# Patient Record
Sex: Female | Born: 1970 | Race: White | Hispanic: No | Marital: Married | Smoking: Current every day smoker
Health system: Southern US, Community
[De-identification: ages and names within clinical notes are randomized; demographics above are authoritative.]

## PROBLEM LIST (undated history)

## (undated) DIAGNOSIS — B1920 Unspecified viral hepatitis C without hepatic coma: Secondary | ICD-10-CM

## (undated) HISTORY — PX: ABDOMINAL HYSTERECTOMY: SHX81

---

## 2016-03-11 ENCOUNTER — Emergency Department (HOSPITAL_COMMUNITY)
Admission: EM | Admit: 2016-03-11 | Discharge: 2016-03-11 | Disposition: A | Discharge status: Home / Self Care | Payer: Self-pay | Attending: Emergency Medicine | Admitting: Emergency Medicine

## 2016-03-11 ENCOUNTER — Emergency Department (HOSPITAL_COMMUNITY): Payer: Self-pay

## 2016-03-11 ENCOUNTER — Encounter (HOSPITAL_COMMUNITY): Payer: Self-pay | Admitting: Emergency Medicine

## 2016-03-11 DIAGNOSIS — T7840XA Allergy, unspecified, initial encounter: Secondary | ICD-10-CM

## 2016-03-11 DIAGNOSIS — F1110 Opioid abuse, uncomplicated: Secondary | ICD-10-CM | POA: Insufficient documentation

## 2016-03-11 DIAGNOSIS — T401X1A Poisoning by heroin, accidental (unintentional), initial encounter: Secondary | ICD-10-CM

## 2016-03-11 DIAGNOSIS — Y9289 Other specified places as the place of occurrence of the external cause: Secondary | ICD-10-CM | POA: Insufficient documentation

## 2016-03-11 DIAGNOSIS — Z8619 Personal history of other infectious and parasitic diseases: Secondary | ICD-10-CM | POA: Insufficient documentation

## 2016-03-11 DIAGNOSIS — Y998 Other external cause status: Secondary | ICD-10-CM | POA: Insufficient documentation

## 2016-03-11 DIAGNOSIS — F1910 Other psychoactive substance abuse, uncomplicated: Secondary | ICD-10-CM

## 2016-03-11 DIAGNOSIS — L500 Allergic urticaria: Secondary | ICD-10-CM | POA: Insufficient documentation

## 2016-03-11 DIAGNOSIS — Z880 Allergy status to penicillin: Secondary | ICD-10-CM | POA: Insufficient documentation

## 2016-03-11 DIAGNOSIS — F1410 Cocaine abuse, uncomplicated: Secondary | ICD-10-CM | POA: Insufficient documentation

## 2016-03-11 DIAGNOSIS — Z79899 Other long term (current) drug therapy: Secondary | ICD-10-CM | POA: Insufficient documentation

## 2016-03-11 DIAGNOSIS — K5900 Constipation, unspecified: Secondary | ICD-10-CM

## 2016-03-11 DIAGNOSIS — Y9389 Activity, other specified: Secondary | ICD-10-CM | POA: Insufficient documentation

## 2016-03-11 DIAGNOSIS — R062 Wheezing: Secondary | ICD-10-CM | POA: Insufficient documentation

## 2016-03-11 DIAGNOSIS — F1510 Other stimulant abuse, uncomplicated: Secondary | ICD-10-CM | POA: Insufficient documentation

## 2016-03-11 HISTORY — DX: Unspecified viral hepatitis C without hepatic coma: B19.20

## 2016-03-11 LAB — RAPID URINE DRUG SCREEN, HOSP PERFORMED
Amphetamines: POSITIVE — AB
BARBITURATES: NOT DETECTED
BENZODIAZEPINES: NOT DETECTED
Cocaine: POSITIVE — AB
Opiates: POSITIVE — AB
Tetrahydrocannabinol: NOT DETECTED

## 2016-03-11 LAB — CBC WITH DIFFERENTIAL/PLATELET
BASOS PCT: 0 %
Basophils Absolute: 0 10*3/uL (ref 0.0–0.1)
Eosinophils Absolute: 0.2 10*3/uL (ref 0.0–0.7)
Eosinophils Relative: 2 %
HCT: 35.2 % — ABNORMAL LOW (ref 36.0–46.0)
Hemoglobin: 11.4 g/dL — ABNORMAL LOW (ref 12.0–15.0)
LYMPHS PCT: 5 %
Lymphs Abs: 0.5 10*3/uL — ABNORMAL LOW (ref 0.7–4.0)
MCH: 29.6 pg (ref 26.0–34.0)
MCHC: 32.4 g/dL (ref 30.0–36.0)
MCV: 91.4 fL (ref 78.0–100.0)
MONO ABS: 0.4 10*3/uL (ref 0.1–1.0)
MONOS PCT: 5 %
NEUTROS ABS: 8.3 10*3/uL — AB (ref 1.7–7.7)
Neutrophils Relative %: 88 %
Platelets: 386 10*3/uL (ref 150–400)
RBC: 3.85 MIL/uL — ABNORMAL LOW (ref 3.87–5.11)
RDW: 14.6 % (ref 11.5–15.5)
WBC: 9.5 10*3/uL (ref 4.0–10.5)

## 2016-03-11 LAB — ETHANOL

## 2016-03-11 LAB — URINE MICROSCOPIC-ADD ON

## 2016-03-11 LAB — URINALYSIS, ROUTINE W REFLEX MICROSCOPIC
Bilirubin Urine: NEGATIVE
Glucose, UA: 100 mg/dL — AB
Ketones, ur: NEGATIVE mg/dL
Nitrite: POSITIVE — AB
PROTEIN: 100 mg/dL — AB
Specific Gravity, Urine: 1.023 (ref 1.005–1.030)
pH: 6 (ref 5.0–8.0)

## 2016-03-11 LAB — COMPREHENSIVE METABOLIC PANEL
ALT: 37 U/L (ref 14–54)
ANION GAP: 9 (ref 5–15)
AST: 35 U/L (ref 15–41)
Albumin: 3.8 g/dL (ref 3.5–5.0)
Alkaline Phosphatase: 71 U/L (ref 38–126)
BILIRUBIN TOTAL: 0.3 mg/dL (ref 0.3–1.2)
BUN: 7 mg/dL (ref 6–20)
CO2: 24 mmol/L (ref 22–32)
Calcium: 8.6 mg/dL — ABNORMAL LOW (ref 8.9–10.3)
Chloride: 103 mmol/L (ref 101–111)
Creatinine, Ser: 0.74 mg/dL (ref 0.44–1.00)
GFR calc Af Amer: >60 mL/min (ref >60)
Glucose, Bld: 132 mg/dL — ABNORMAL HIGH (ref 65–99)
POTASSIUM: 3.6 mmol/L (ref 3.5–5.1)
Sodium: 136 mmol/L (ref 135–145)
TOTAL PROTEIN: 7 g/dL (ref 6.5–8.1)

## 2016-03-11 LAB — SALICYLATE LEVEL

## 2016-03-11 LAB — MAGNESIUM: MAGNESIUM: 1.7 mg/dL (ref 1.7–2.4)

## 2016-03-11 MED ORDER — POLYETHYLENE GLYCOL 3350 17 G PO PACK: 17.0000 g | PACK | Freq: Every day | ORAL | Status: AC

## 2016-03-11 MED ORDER — DOCUSATE SODIUM 100 MG PO CAPS: 100.0000 mg | ORAL_CAPSULE | Freq: Two times a day (BID) | ORAL | Status: AC

## 2016-03-11 MED ORDER — METHYLPREDNISOLONE SODIUM SUCC 125 MG IJ SOLR
125.0000 mg | Freq: Once | INTRAMUSCULAR | Status: AC
Start: 2016-03-11 — End: 2016-03-11
  Administered 2016-03-11: 125 mg via INTRAVENOUS

## 2016-03-11 MED ORDER — DIPHENHYDRAMINE HCL 50 MG/ML IJ SOLN
25.0000 mg | Freq: Once | INTRAMUSCULAR | Status: DC
  Filled 2016-03-11: qty 1

## 2016-03-11 MED ORDER — METHYLPREDNISOLONE SODIUM SUCC 125 MG IJ SOLR
125.0000 mg | Freq: Once | INTRAMUSCULAR | Status: DC
  Filled 2016-03-11: qty 2

## 2016-03-11 MED ORDER — FAMOTIDINE IN NACL 20-0.9 MG/50ML-% IV SOLN
20.0000 mg | Freq: Once | INTRAVENOUS | Status: AC
  Administered 2016-03-11: 20 mg via INTRAVENOUS
  Filled 2016-03-11: qty 50

## 2016-03-11 MED ORDER — PREDNISONE 20 MG PO TABS: 40.0000 mg | ORAL_TABLET | Freq: Every day | ORAL | Status: DC

## 2016-03-11 MED ORDER — ALBUTEROL SULFATE (2.5 MG/3ML) 0.083% IN NEBU
5.0000 mg | INHALATION_SOLUTION | Freq: Once | RESPIRATORY_TRACT | Status: AC
  Administered 2016-03-11: 5 mg via RESPIRATORY_TRACT
  Filled 2016-03-11: qty 6

## 2016-03-11 MED ORDER — DIPHENHYDRAMINE HCL 25 MG PO TABS: 25.0000 mg | ORAL_TABLET | Freq: Four times a day (QID) | ORAL | Status: AC | PRN

## 2016-03-11 MED ORDER — SODIUM CHLORIDE 0.9 % IV BOLUS (SEPSIS)
1000.0000 mL | Freq: Once | INTRAVENOUS | Status: AC
  Administered 2016-03-11: 1000 mL via INTRAVENOUS

## 2016-03-11 MED ORDER — NITROFURANTOIN MONOHYD MACRO 100 MG PO CAPS: 100.0000 mg | ORAL_CAPSULE | Freq: Two times a day (BID) | ORAL | Status: AC

## 2016-03-11 MED ORDER — METRONIDAZOLE 500 MG PO TABS: 500.0000 mg | ORAL_TABLET | Freq: Two times a day (BID) | ORAL | Status: AC

## 2016-03-11 NOTE — ED Provider Notes (Signed)
CSN: [id number]     Arrival date & time 03/11/16  1822 History   First MD Initiated Contact with Patient 03/11/16 1829     Chief Complaint  Patient presents with  . Drug Overdose     (Consider location/radiation/quality/duration/timing/severity/associated sxs/prior Treatment) HPI Patient found unresponsive and EMS called. Lying supine with agonal breathing. Syringe is lying on her side. Given IM and IV Narcan. Patient with improvement of alertness. Then developed hives on the left forearm. Given IV Benadryl. Patient states she's been feeling unwell today. Had nasal congestion. Denies any any intraoral swelling or difficulty breathing. States she has not used heroin in several months. States she is a smaller dose than normal. Denies any other coingestants. Past Medical History  Diagnosis Date  . Hepatitis C    No past surgical history on file. No family history on file. Social History  Substance Use Topics  . Smoking status: Not on file  . Smokeless tobacco: Not on file  . Alcohol Use: Not on file   OB History    No data available     Review of Systems  Constitutional: Negative for fever and chills.  HENT: Negative for facial swelling.   Eyes: Negative for visual disturbance.  Respiratory: Positive for wheezing. Negative for shortness of breath.   Cardiovascular: Negative for chest pain and leg swelling.  Gastrointestinal: Negative for nausea, vomiting and abdominal pain.  Genitourinary: Negative for dysuria, frequency and flank pain.  Musculoskeletal: Negative for back pain, neck pain and neck stiffness.  Skin: Negative for rash and wound.  Neurological: Negative for dizziness, weakness, light-headedness, numbness and headaches.  All other systems reviewed and are negative.     Allergies  Penicillins and Sulfa antibiotics  Home Medications   Prior to Admission medications   Medication Sig Start Date End Date Taking? Authorizing Provider  ibuprofen (ADVIL,MOTRIN) 200  MG tablet Take 400-800 mg by mouth 2 (two) times daily as needed for moderate pain.   Yes Historical Provider, MD  diphenhydrAMINE (BENADRYL) 25 MG tablet Take 1 tablet (25 mg total) by mouth every 6 (six) hours as needed for itching or allergies. 03/11/16   Loren Racer, MD  docusate sodium (COLACE) 100 MG capsule Take 1 capsule (100 mg total) by mouth every 12 (twelve) hours. 03/11/16   Loren Racer, MD  metroNIDAZOLE (FLAGYL) 500 MG tablet Take 1 tablet (500 mg total) by mouth 2 (two) times daily. One po bid x 7 days 03/11/16   Loren Racer, MD  nitrofurantoin, macrocrystal-monohydrate, (MACROBID) 100 MG capsule Take 1 capsule (100 mg total) by mouth 2 (two) times daily. X 7 days 03/11/16   Loren Racer, MD  polyethylene glycol [facility] / Ethelene Hal) packet Take 17 g by mouth daily. 03/11/16   Loren Racer, MD  predniSONE (DELTASONE) 20 MG tablet Take 2 tablets (40 mg total) by mouth daily. 03/11/16   Loren Racer, MD   BP 109/66 mmHg  Pulse 88  Temp(Src) 98.2 F (36.8 C) (Oral)  Resp 23  SpO2 92% Physical Exam  Constitutional: She is oriented to person, place, and time. She appears well-developed and well-nourished. No distress.  HENT:  Head: Normocephalic and atraumatic.  Mouth/Throat: Oropharynx is clear and moist. No oropharyngeal exudate.  Poor dentition  Eyes: EOM are normal. Pupils are equal, round, and reactive to light.  Neck: Normal range of motion. Neck supple.  No meningismus. No posterior midline cervical tenderness to palpation.  Cardiovascular: Normal rate and regular rhythm.  Exam reveals no gallop and no  friction rub.   No murmur heard. Pulmonary/Chest: Effort normal. No respiratory distress. She has wheezes (expiratory wheezing). She has no rales. She exhibits no tenderness.  Abdominal: Soft. Bowel sounds are normal. She exhibits no distension and no mass. There is no tenderness. There is no rebound and no guarding.  Musculoskeletal: Normal range of motion. She  exhibits no edema or tenderness.  Patient with track marks bilateral forearms. No obvious hives noted. Distal pulses are equal and intact. No lower extremity swelling or asymmetry.  Neurological: She is alert and oriented to person, place, and time.  5/5 motor in all extremities. Sensation is fully intact.  Skin: Skin is warm and dry. No rash noted. No erythema.  Nursing note and vitals reviewed.   ED Course  Procedures (including critical care time) Labs Review Labs Reviewed  CBC WITH DIFFERENTIAL/PLATELET - Abnormal; Notable for the following:    RBC 3.85 (*)    Hemoglobin 11.4 (*)    HCT 35.2 (*)    Neutro Abs 8.3 (*)    Lymphs Abs 0.5 (*)    All other components within normal limits  COMPREHENSIVE METABOLIC PANEL - Abnormal; Notable for the following:    Glucose, Bld 132 (*)    Calcium 8.6 (*)    All other components within normal limits  URINALYSIS, ROUTINE W REFLEX MICROSCOPIC (NOT AT [facility]) - Abnormal; Notable for the following:    APPearance TURBID (*)    Glucose, UA 100 (*)    Hgb urine dipstick TRACE (*)    Protein, ur 100 (*)    Nitrite POSITIVE (*)    Leukocytes, UA SMALL (*)    All other components within normal limits  URINE RAPID DRUG SCREEN, HOSP PERFORMED - Abnormal; Notable for the following:    Opiates POSITIVE (*)    Cocaine POSITIVE (*)    Amphetamines POSITIVE (*)    All other components within normal limits  URINE MICROSCOPIC-ADD ON - Abnormal; Notable for the following:    Squamous Epithelial / LPF TOO NUMEROUS TO COUNT (*)    Bacteria, UA MANY (*)    All other components within normal limits  SALICYLATE LEVEL  ETHANOL  MAGNESIUM  OCCULT BLOOD X 1 CARD TO LAB, STOOL    Imaging Review Dg Abd 1 View  03/11/2016  CLINICAL DATA:  Abdominal distension and constipation for 4 weeks. EXAM: ABDOMEN - 1 VIEW COMPARISON:  None. FINDINGS: The bowel gas pattern is normal. Extensive bowel content is identified throughout colon. There is scoliosis of spine.  Prior cholecystectomy clips are noted. No radio-opaque calculi or other significant radiographic abnormality are seen. IMPRESSION: Constipation.  No bowel obstruction. Electronically Signed   By: Sherian Rein M.D.   On: 03/11/2016 20:42   Dg Chest Port 1 View  03/11/2016  CLINICAL DATA:  Wheezing and hives of left forearm. EXAM: PORTABLE CHEST 1 VIEW COMPARISON:  None. FINDINGS: The heart size and mediastinal contours are within normal limits. Both lungs are clear. The visualized skeletal structures are unremarkable. IMPRESSION: No active cardiopulmonary disease. Electronically Signed   By: Sherian Rein M.D.   On: 03/11/2016 18:56   I have personally reviewed and evaluated these images and lab results as part of my medical decision-making.   EKG Interpretation   Date/Time:  Tuesday Mar 11 2016 19:33:19 EDT Ventricular Rate:  111 PR Interval:  128 QRS Duration: 100 QT Interval:  338 QTC Calculation: 459 R Axis:   52 Text Interpretation:  Sinus tachycardia Probable left atrial enlargement  Confirmed by Ranae PalmsYELVERTON  MD, Avanish Cerullo ([id number]) on 03/11/2016 7:55:18 PM      MDM   Final diagnoses:  Heroin overdose, accidental or unintentional, initial encounter  Polysubstance abuse  Constipation, unspecified constipation type  Allergic reaction caused by a drug   Wheezing has improved after nebulized treatment. She remains alert and emergency department. Complains of constipation and abdominal distention for the past few days.   Patient continues to be alert and emergency department. Stable vital signs. UDS positive for amphetamines and cocaine as well as opiates. Start on bowel regimen. Given behavior health resources.   Loren Raceravid Zhaire Locker, MD 03/11/16 2256

## 2016-03-11 NOTE — ED Notes (Signed)
Per EMS pt was found supine with agonal breathing, syringes lying around her. EMS gave 3mg  IM and 1mg  IV of narcan. EMS noticed hives forming on left forearm and administered 50mg  benadryl IV. A&0.

## 2016-03-11 NOTE — Discharge Instructions (Signed)
Accidental Overdose °A drug overdose occurs when a chemical substance (drug or medication) is used in amounts large enough to overcome a person. This may result in severe illness or death. This is a type of poisoning. Accidental overdoses of medications or other substances come from a variety of reasons. When this happens accidentally, it is often because the person taking the substance does not know enough about what they have taken. Drugs which commonly cause overdose deaths are alcohol, psychotropic medications (medications which affect the mind), pain medications, illegal drugs (street drugs) such as cocaine and heroin, and multiple drugs taken at the same time. It may result from careless behavior (such as over-indulging at a party). Other causes of overdose may include multiple drug use, a lapse in memory, or drug use after a period of no drug use.  °Sometimes overdosing occurs because a person cannot remember if they have taken their medication.  °A common unintentional overdose in young children involves multi-vitamins containing iron. Iron is a part of the hemoglobin molecule in blood. It is used to transport oxygen to living cells. When taken in small amounts, iron allows the body to restock hemoglobin. In large amounts, it causes problems in the body. If this overdose is not treated, it can lead to death. °Never take medicines that show signs of tampering or do not seem quite right. Never take medicines in the dark or in poor lighting. Read the label and check each dose of medicine before you take it. When adults are poisoned, it happens most often through carelessness or lack of information. Taking medicines in the dark or taking medicine prescribed for someone else to treat the same type of problem is a dangerous practice. °SYMPTOMS  °Symptoms of overdose depend on the medication and amount taken. They can vary from over-activity with stimulant over-dosage, to sleepiness from depressants such as  alcohol, narcotics and tranquilizers. Confusion, dizziness, nausea and vomiting may be present. If problems are severe enough coma and death may result. °DIAGNOSIS  °Diagnosis and management are generally straightforward if the drug is known. Otherwise it is more difficult. At times, certain symptoms and signs exhibited by the patient, or blood tests, can reveal the drug in question.  °TREATMENT  °In an emergency department, most patients can be treated with supportive measures. Antidotes may be available if there has been an overdose of opioids or benzodiazepines. A rapid improvement will often occur if this is the cause of overdose. °At home or away from medical care: °· There may be no immediate problems or warning signs in children. °· Not everything works well in all cases of poisoning. °· Take immediate action. Poisons may act quickly. °· If you think someone has swallowed medicine or a household product, and the person is unconscious, having seizures (convulsions), or is not breathing, immediately call for an ambulance. °IF a person is conscious and appears to be doing OK but has swallowed a poison: °· Do not wait to see what effect the poison will have. Immediately call a poison control center (listed in the white pages of your telephone book under "Poison Control" or inside the front cover with other emergency numbers). Some poison control centers have TTY capability for the deaf. Check with your local center if you or someone in your family requires this service. °· Keep the container so you can read the label on the product for ingredients. °· Describe what, when, and how much was taken and the age and condition of the person poisoned.   Inform them if the person is vomiting, choking, drowsy, shows a change in color or temperature of skin, is conscious or unconscious, or is convulsing.  Do not cause vomiting unless instructed by medical personnel. Do not induce vomiting or force liquids into a person who  is convulsing, unconscious, or very drowsy. Stay calm and in control.   Activated charcoal also is sometimes used in certain types of poisoning and you may wish to add a supply to your emergency medicines. It is available without a prescription. Call a poison control center before using this medication. PREVENTION  Thousands of children die every year from unintentional poisoning. This may be from household chemicals, poisoning from carbon monoxide in a car, taking their parent's medications, or simply taking a few iron pills or vitamins with iron. Poisoning comes from unexpected sources.  Store medicines out of the sight and reach of children, preferably in a locked cabinet. Do not keep medications in a food cabinet. Always store your medicines in a secure place. Get rid of expired medications.  If you have children living with you or have them as occasional guests, you should have child-resistant caps on your medicine containers. Keep everything out of reach. Child proof your home.  If you are called to the telephone or to answer the door while you are taking a medicine, take the container with you or put the medicine out of the reach of small children.  Do not take your medication in front of children. Do not tell your child how good a medication is and how good it is for them. They may get the idea it is more of a treat.  If you are an adult and have accidentally taken an overdose, you need to consider how this happened and what can be done to prevent it from happening again. If this was from a street drug or alcohol, determine if there is a problem that needs addressing. If you are not sure a problems exists, it is easy to talk to a professional and ask them if they think you have a problem. It is better to handle this problem in this way before it happens again and has a much worse consequence.   This information is not intended to replace advice given to you by your health care provider. Make  sure you discuss any questions you have with your health care provider.   Document Released: 01/10/2005 Document Revised: 11/17/2014 Document Reviewed: 04/16/2015 Elsevier Interactive Patient Education 2016 [employer].  Constipation, Adult Constipation is when a person has fewer than three bowel movements a week, has difficulty having a bowel movement, or has stools that are dry, hard, or larger than normal. As people grow older, constipation is more common. A low-fiber diet, not taking in enough fluids, and taking certain medicines may make constipation worse.  CAUSES   Certain medicines, such as antidepressants, pain medicine, iron supplements, antacids, and water pills.   Certain diseases, such as diabetes, irritable bowel syndrome (IBS), thyroid disease, or depression.   Not drinking enough water.   Not eating enough fiber-rich foods.   Stress or travel.   Lack of physical activity or exercise.   Ignoring the urge to have a bowel movement.   Using laxatives too much.  SIGNS AND SYMPTOMS   Having fewer than three bowel movements a week.   Straining to have a bowel movement.   Having stools that are hard, dry, or larger than normal.   Feeling full or bloated.  Pain in the lower abdomen.   Not feeling relief after having a bowel movement.  DIAGNOSIS  Your health care provider will take a medical history and perform a physical exam. Further testing may be done for severe constipation. Some tests may include:  A barium enema X-ray to examine your rectum, colon, and, sometimes, your small intestine.   A sigmoidoscopy to examine your lower colon.   A colonoscopy to examine your entire colon. TREATMENT  Treatment will depend on the severity of your constipation and what is causing it. Some dietary treatments include drinking more fluids and eating more fiber-rich foods. Lifestyle treatments may include regular exercise. If these diet and lifestyle  recommendations do not help, your health care provider may recommend taking over-the-counter laxative medicines to help you have bowel movements. Prescription medicines may be prescribed if over-the-counter medicines do not work.  HOME CARE INSTRUCTIONS   Eat foods that have a lot of fiber, such as fruits, vegetables, whole grains, and beans.  Limit foods high in fat and processed sugars, such as french fries, hamburgers, cookies, candies, and soda.   A fiber supplement may be added to your diet if you cannot get enough fiber from foods.   Drink enough fluids to keep your urine clear or pale yellow.   Exercise regularly or as directed by your health care provider.   Go to the restroom when you have the urge to go. Do not hold it.   Only take over-the-counter or prescription medicines as directed by your health care provider. Do not take other medicines for constipation without talking to your health care provider first.  SEEK IMMEDIATE MEDICAL CARE IF:   You have bright red blood in your stool.   Your constipation lasts for more than 4 days or gets worse.   You have abdominal or rectal pain.   You have thin, pencil-like stools.   You have unexplained weight loss. MAKE SURE YOU:   Understand these instructions.  Will watch your condition.  Will get help right away if you are not doing well or get worse.   This information is not intended to replace advice given to you by your health care provider. Make sure you discuss any questions you have with your health care provider.   Document Released: 07/25/2004 Document Revised: 11/17/2014 Document Reviewed: 08/08/2013 Elsevier Interactive Patient Education 2016 [employer]. Substance Abuse Treatment Programs  Intensive Outpatient Programs [facility]     [address]      [place], [place]                   [phone]       [facility] [address] [place] [address] [place],  [place] [phone]  [facility]     (Inpatient and outpatient)     [address]           [phone]    [facility] [phone] (Suboxone and Methadone)  [address]      [place], [place] [address]      [phone]       [address] [place], [place] [phone]  [facility] (Outpatient/Inpatient, Chemical)    (insurance only) [phone]             [facility] (Groups & Residential) [place], [place] [phone]     [facility]     [address]     [place], [place]  [phone]       [facility] (for caregivers and family) [address] [place], [place] [phone]      Residential Treatment Programs [facility]      [address] [place], [place] [address]  [phone]       [facility] [address] [place], [place] [address] [phone]  [facility] [place]        [phone]       [facility] [phone]  [facility] ([facility]             [address]                                         [place], [place]                                                [phone] or [phone]                               [facility] [place] [address] [place], [address] [phone]  [facility]    [address]      [place], [place]     [phone]       [facility] [address] [place], [place] [phone]  [facility]   [address] [place], [place] [address]     [phone]      Admissions: 8am-3pm M-F  [facility] ([facility]) [address] [place], [place] [phone]  [facility]: Residential Program [phone] Days)   [place], [place]      [phone] or [phone]     AD[facility]C: [facility] [place], [place] (Walk in Hours over the weekend or by referral)  [facility] [address] [place], [place], [place] [address] [phone]  Crisis Mobile: [facility]:  [phone] (for crisis response 24 hours a day) [facility] Hotline:      [phone] Outpatient Psychiatry and Counseling  [facility]: Mobile Crisis Management 24 hours:  [phone]  [facility] of the [employer] sliding scale fee and walk in schedule: M-F 8am-12pm/1pm-3pm [address]  [place], [place] [address] [phone]  [facility] [address] [place], [place] [address] [phone]  [facility] ([facility] [employer])- new patient walk-in appointments available Monday - Friday 8am -3pm.          [address] [place], [place] [address] [phone] or crisis line- [phone]  [facility]/ Intensive Outpatient Therapy Program [address] [place], [place] [address] [phone]  [facility] Mental Health                  Crisis Services      [phone] [address]     [place], [place] [address]                 [facility]   [facility] [phone]. [address] [place], [place] [address]   [facility] Care          [address]  [address] [place],  [place], [place] [address]       [phone]  [facility] [address] [place], [place] [address] [phone]  [facility]    [address]    [place], [place] [address]     [phone]       Andee PolesParish McKinney, MD     [address]     [place] [address]     [phone]       [facility] [address] [place] [address]  [facility]     [address]     [place], [place]      [phone]       [facility] Eulogio DitchShamsher Ahluwalia, MD [address] [place], [place] [address] [phone]  [facility]     [address]     [place], [place] [address]     [phone]       [facility] [address] [place], [place] [address] [phone] Resources for Temporary Residential Assistance/Crisis Centers  [facility]ENT[facility]) M-F 8am-3pm   [address] [place], [place] [address]   [phone] Services include: laundry, barbering, support groups, case management, phone  & computer access, showers, AA/NA mtgs, mental health/substance abuse nurse, job skills class, disability information, VA assistance, spiritual classes, etc.   HOMELESS SHELTERS  [facility]     [facility]   [address], [place]     [phone]              [facility] (women and children)       [address] [place], [place] [address] [phone] [email or web address] for application and process Application Required  [facility]   [address]    [place] [address]     [phone]                    [facility] [address] [place], [place] [address] [phone](schedule application appt.) Application Required  [facility] (women only)    [address]     [place], [place] [address]     [phone]      Intake starts 6pm daily Need valid ID, SSC, & Police report [facility] [place] [address] [place], [place] [phone] Application Required  [facility] (men only)     [address]      [place], [place]     [phone]       [facility] (Pregnant women only) [address] [place], [place] [phone]  [facility]      [address]      [place], [place] [address]     [phone]             [facility] [address] [place], [place] [phone] 90 day commitment/SA/Application process  [facility](men only)     [address]     [place], [place]     [phone]       Check-in at [facility]            [facility] [address] [place], [place] [address] [phone] Men/Women/Women and Children must be there by 7 pm  [facility]  [place], [place]

## 2016-03-11 NOTE — ED Notes (Signed)
Patient was alert, oriented and stable upon discharge. RN went over AVS and patient had no further questions.  

## 2016-03-11 NOTE — ED Notes (Signed)
Bed: RESA Expected date:  Expected time:  Means of arrival:  Comments: EMS - OD heroin and narcan/allergic reaction

## 2016-03-11 NOTE — ED Notes (Signed)
EKG given to EDP,Yao,MD., for review. 

## 2016-03-14 ENCOUNTER — Emergency Department (HOSPITAL_BASED_OUTPATIENT_CLINIC_OR_DEPARTMENT_OTHER)
Admission: EM | Admit: 2016-03-14 | Discharge: 2016-03-14 | Disposition: A | Discharge status: Home / Self Care | Payer: Self-pay | Attending: Emergency Medicine | Admitting: Emergency Medicine

## 2016-03-14 ENCOUNTER — Encounter (HOSPITAL_BASED_OUTPATIENT_CLINIC_OR_DEPARTMENT_OTHER): Payer: Self-pay | Admitting: Emergency Medicine

## 2016-03-14 ENCOUNTER — Emergency Department (HOSPITAL_BASED_OUTPATIENT_CLINIC_OR_DEPARTMENT_OTHER): Payer: Self-pay

## 2016-03-14 DIAGNOSIS — R000 Tachycardia, unspecified: Secondary | ICD-10-CM | POA: Insufficient documentation

## 2016-03-14 DIAGNOSIS — F17200 Nicotine dependence, unspecified, uncomplicated: Secondary | ICD-10-CM | POA: Insufficient documentation

## 2016-03-14 DIAGNOSIS — R062 Wheezing: Secondary | ICD-10-CM | POA: Insufficient documentation

## 2016-03-14 DIAGNOSIS — Z79899 Other long term (current) drug therapy: Secondary | ICD-10-CM | POA: Insufficient documentation

## 2016-03-14 DIAGNOSIS — L519 Erythema multiforme, unspecified: Secondary | ICD-10-CM | POA: Insufficient documentation

## 2016-03-14 LAB — URINE MICROSCOPIC-ADD ON

## 2016-03-14 LAB — URINALYSIS, ROUTINE W REFLEX MICROSCOPIC
Bilirubin Urine: NEGATIVE
Glucose, UA: NEGATIVE mg/dL
Hgb urine dipstick: NEGATIVE
KETONES UR: NEGATIVE mg/dL
NITRITE: NEGATIVE
PH: 6.5 (ref 5.0–8.0)
Protein, ur: NEGATIVE mg/dL
SPECIFIC GRAVITY, URINE: 1.005 (ref 1.005–1.030)

## 2016-03-14 MED ORDER — IPRATROPIUM BROMIDE 0.02 % IN SOLN
0.5000 mg | Freq: Once | RESPIRATORY_TRACT | Status: AC
  Administered 2016-03-14: 0.5 mg via RESPIRATORY_TRACT
  Filled 2016-03-14: qty 2.5

## 2016-03-14 MED ORDER — METHYLPREDNISOLONE SODIUM SUCC 125 MG IJ SOLR
125.0000 mg | Freq: Once | INTRAMUSCULAR | Status: AC
  Administered 2016-03-14: 125 mg via INTRAVENOUS
  Filled 2016-03-14: qty 2

## 2016-03-14 MED ORDER — METRONIDAZOLE 500 MG PO TABS
2000.0000 mg | ORAL_TABLET | Freq: Once | ORAL | Status: AC
  Administered 2016-03-14: 2000 mg via ORAL
  Filled 2016-03-14: qty 4

## 2016-03-14 MED ORDER — FAMOTIDINE IN NACL 20-0.9 MG/50ML-% IV SOLN
20.0000 mg | Freq: Once | INTRAVENOUS | Status: AC
  Administered 2016-03-14: 20 mg via INTRAVENOUS
  Filled 2016-03-14: qty 50

## 2016-03-14 MED ORDER — ALBUTEROL SULFATE (2.5 MG/3ML) 0.083% IN NEBU
5.0000 mg | INHALATION_SOLUTION | Freq: Once | RESPIRATORY_TRACT | Status: AC
  Administered 2016-03-14: 5 mg via RESPIRATORY_TRACT
  Filled 2016-03-14: qty 6

## 2016-03-14 MED ORDER — PREDNISONE 20 MG PO TABS: 40.0000 mg | ORAL_TABLET | Freq: Every day | ORAL | Status: AC

## 2016-03-14 MED ORDER — DIPHENHYDRAMINE HCL 50 MG/ML IJ SOLN
25.0000 mg | Freq: Once | INTRAMUSCULAR | Status: AC
  Administered 2016-03-14: 25 mg via INTRAVENOUS
  Filled 2016-03-14: qty 1

## 2016-03-14 NOTE — Discharge Instructions (Signed)
Erythema Multiforme Erythema multiforme is a rash that usually occurs on the skin, but can also occur on the lips and on the inside of the mouth. It is usually a mild condition that goes away on its own. It most often affects young adults and children. The rash shows up suddenly and often lasts 1-4 weeks. In some cases, the rash may come back again after clearing up. CAUSES  The cause of erythema multiforme may be an overreaction by the body's immune system to a trigger.  Common triggers include:   Infection, most commonly by the cold sore virus (human herpes virus, HSV), bacteria, or fungus. Less common triggers include:   Medicines.   Other illnesses.  In some cases, the cause may not be known.  SIGNS AND SYMPTOMS  The rash from erythema multiforme shows up suddenly. It may appear days after exposure to the trigger. It may start as small, red, round or oval marks that become bumps or raised welts over 24-48 hours. These bumps may resemble a target or a "bull's eye." These can spread and be quite large (about 1 inch [2.5 cm]). There may be mild itching or burning of the skin at first.  These skin changes usually appear first on the backs of the hands. They may then spread to the tops of the feet, the arms, the elbows, the knees, the palms, and the soles of the feet. There may be a mild rash on the lips and lining of the mouth. The skin rash may show up in waves over a few days.  It may take 2-4 weeks for the rash to go away. The rash may return at a later time.  DIAGNOSIS  Diagnosis of erythema multiforme is usually made based on a physical exam and medical history. To help confirm the diagnosis, a small piece of skin tissue is sometimes removed (skin biopsy) so it can be examined under a microscope by a specialist (pathologist). TREATMENT  Most episodes of erythema multiforme heal on their own. Treatment may not be needed. Your health care provider will recommend removing or avoiding the  trigger if possible. If the trigger is an infection or other illness, you may receive treatment for that infection or illness. You may also be given medicine for itching. Other medicines may be used for severe cases or to help prevent repeat bouts of erythema multiforme.  HOME CARE INSTRUCTIONS   Take medicines only as directed by your health care provider.   If possible, avoid known triggers.   If a medicine was your trigger, be sure to notify all of your health care providers. You should avoid this medicine or any like it in the future.   If your trigger was a herpes virus infection, use sunscreen lotion and sunscreen-containing lip balm to prevent sunlight triggered outbreaks of herpes virus.   Apply moist compresses as needed to help control itching. Cool or warm baths may also help. Avoid hot baths or showers.   Eat soft foods if you have mouth sores.   Keep all follow-up visits as directed by your health care provider. This is important.  SEEK MEDICAL CARE IF:   Your rash shows up again in the future.  You have a fever. SEEK IMMEDIATE MEDICAL CARE IF:   You develop redness and swelling on your lips or in your mouth.  You have a burning feeling on your lips or in your mouth.  You develop blisters or open sores on your mouth, lips, vagina, penis, or   anus.  You have eye pain, or you have redness or drainage in your eye.  You develop blisters on your skin.  You have difficulty breathing.  You have difficulty swallowing, or you start drooling.  You have blood in your urine.  You have pain with urination.   This information is not intended to replace advice given to you by your health care provider. Make sure you discuss any questions you have with your health care provider.   Document Released: 10/27/2005 Document Revised: 11/17/2014 Document Reviewed: 06/20/2014 Elsevier Interactive Patient Education 2016 [employer] Inc.  

## 2016-03-14 NOTE — ED Notes (Signed)
Pt states itching has improved some.

## 2016-03-14 NOTE — ED Provider Notes (Signed)
CSN: [id number]     Arrival date & time 03/14/16  2841 History   First MD Initiated Contact with Patient 03/14/16 [phone]     Chief Complaint  Patient presents with  . Allergic Reaction     (Consider location/radiation/quality/duration/timing/severity/associated sxs/prior Treatment) HPI Comments: Patient is a 45 year old female presenting today with worsening rash. Patient has a history of heroin abuse and hepatitis C but recently moved here from [place] on Monday. Patient states she had not used hair when since last August but on Wednesday she injected heroin when resulting in respiratory depression requiring 911, multiple doses of IM Narcan and emergency room evaluation.  Patient does not know what it was in the heroin but she has never had a reaction like this before.  Apparently on Wednesday she developed the rash on her left forearm but it is now involved her entire body. She also notes that on Tuesday after arriving here from [place] she has had nasal congestion, cough and intermittent chills. She denies documented fever or shortness of breath. No nausea, vomiting, abdominal pain or diarrhea. Patient was found to have Trichomonas while in the emergency room and was given prescription for antibiotics which she has not filled due to finances. She is taking Benadryl intermittently without help.  During emergency room stay she was found to be positive for opiates, cocaine and amphetamines. Her urine was contaminated with too numerous to count epithelial cells so unclear if patient truly had a urinary tract infection. But was positive for trichomonas. Today patient denies any dysuria or vaginal discharge. However she has had multiple sexual partners and is at risk for STI.  Patient is a 45 y.o. female presenting with allergic reaction. The history is provided by the patient.  Allergic Reaction Presenting symptoms: rash   Severity:  Severe Prior allergic episodes:  No prior episodes Context: medications    Relieved by:  Nothing Worsened by:  Nothing tried Ineffective treatments:  Antihistamines   Past Medical History  Diagnosis Date  . Hepatitis C    Past Surgical History  Procedure Laterality Date  . Abdominal hysterectomy     No family history on file. Social History  Substance Use Topics  . Smoking status: Current Every Day Smoker  . Smokeless tobacco: None  . Alcohol Use: No   OB History    No data available     Review of Systems  Skin: Positive for rash.  All other systems reviewed and are negative.     Allergies  Penicillins and Sulfa antibiotics  Home Medications   Prior to Admission medications   Medication Sig Start Date End Date Taking? Authorizing Provider  diphenhydrAMINE (BENADRYL) 25 MG tablet Take 1 tablet (25 mg total) by mouth every 6 (six) hours as needed for itching or allergies. 03/11/16  Yes Julianne Rice, MD  docusate sodium (COLACE) 100 MG capsule Take 1 capsule (100 mg total) by mouth every 12 (twelve) hours. 03/11/16   Julianne Rice, MD  ibuprofen (ADVIL,MOTRIN) 200 MG tablet Take 400-800 mg by mouth 2 (two) times daily as needed for moderate pain.    Historical Provider, MD  metroNIDAZOLE (FLAGYL) 500 MG tablet Take 1 tablet (500 mg total) by mouth 2 (two) times daily. One po bid x 7 days 03/11/16   Julianne Rice, MD  nitrofurantoin, macrocrystal-monohydrate, (MACROBID) 100 MG capsule Take 1 capsule (100 mg total) by mouth 2 (two) times daily. X 7 days 03/11/16   Julianne Rice, MD  polyethylene glycol [facility] / Floria Raveling) packet Take  17 g by mouth daily. 03/11/16   Julianne Rice, MD  predniSONE (DELTASONE) 20 MG tablet Take 2 tablets (40 mg total) by mouth daily. 03/11/16   Julianne Rice, MD   BP 103/73 mmHg  Pulse 100  Temp(Src) 97.8 F (36.6 C) (Oral)  Resp 18  Ht 5\' 7"  (1.702 m)  Wt 135 lb (61.236 kg)  BMI 21.14 kg/m2  SpO2 100% Physical Exam  Constitutional: She is oriented to person, place, and time. She appears well-developed and  well-nourished. No distress.  HENT:  Head: Normocephalic and atraumatic.  Mouth/Throat: Oropharynx is clear and moist and mucous membranes are normal.  Poor dentition. No lesions noted.  Periorbital edema.  No oral swelling and no pharyngeal swelling  Eyes: Conjunctivae and EOM are normal. Pupils are equal, round, and reactive to light.  Neck: Normal range of motion. Neck supple.  Cardiovascular: Regular rhythm and intact distal pulses.  Tachycardia present.   No murmur heard. Pulmonary/Chest: Effort normal. No respiratory distress. She has wheezes. She has no rales.  Scant occasional expiratory wheeze  Abdominal: Soft. She exhibits no distension. There is no tenderness. There is no rebound and no guarding.  Musculoskeletal: Normal range of motion. She exhibits no edema or tenderness.  Neurological: She is alert and oriented to person, place, and time.  Skin: Skin is warm and dry. Rash noted. Rash is maculopapular. Rash is not pustular and not vesicular. No erythema.  Patchy raised target type circular lesions diffusely over the body. Nontender and blanching  Psychiatric: She has a normal mood and affect. Her behavior is normal.  Nursing note and vitals reviewed.   ED Course  Procedures (including critical care time) Labs Review Labs Reviewed  URINALYSIS, ROUTINE W REFLEX MICROSCOPIC (NOT AT [facility]) - Abnormal; Notable for the following:    Leukocytes, UA TRACE (*)    All other components within normal limits  URINE MICROSCOPIC-ADD ON - Abnormal; Notable for the following:    Squamous Epithelial / LPF 0-5 (*)    Bacteria, UA RARE (*)    All other components within normal limits    Imaging Review Dg Chest 2 View  03/14/2016  CLINICAL DATA:  Diffuse hiatus and itching, patient reports overdose of heroin 2 days ago and treated with Narcan; patient reports onset of symptoms after Narcan administration, difficulty breathing, history of hepatitis-C EXAM: CHEST  2 VIEW COMPARISON:  Portable  chest x-ray of Mar 11, 2016 FINDINGS: The lungs are adequately inflated. The interstitial markings are slightly more conspicuous bilaterally. There is no alveolar infiltrate. There is no pleural effusion, pneumothorax, or pneumomediastinum. The heart and pulmonary vascularity are normal. The mediastinum is normal in width. The bony thorax exhibits no acute abnormality. IMPRESSION: Mild pulmonary interstitial prominence today as compared to the previous study which may reflect minimal interstitial edema. There is no alveolar infiltrate nor evidence of pulmonary vascular congestion. Electronically Signed   By: David  Martinique M.D.   On: 03/14/2016 10:04   I have personally reviewed and evaluated these images and lab results as part of my medical decision-making.   EKG Interpretation None      MDM   Final diagnoses:  Erythema multiforme    Patient is a 45 year old female presenting today with a worsening rash now involving her entire body. She has taken occasional doses of Benadryl without improvement. Patient recently moved here from [place] on Monday and started having URI symptoms the next day. She has no history of allergies but has never lived in the  [place]. She does have a history of heroin abuse but had not used since last August until Wednesday she used hair when resulting in a 911 call multiple doses of Narcan and being seen at [facility] for drug overdose. At that time patient's labs were within normal limits however she was positive for trichomonas. The rash started on her arm on Wednesday during that encounter. However now it is worsened. She has never had anything like this before. She did not fill any medications because of finances. He is to have a cough but denies fever. On exam patient has evidence of erythema multiform. At this time there does not appear to be mucosal involvement. She received no medications that are known to cause [employer].    Patient given Benadryl,  Solu-Medrol and Pepcid. UA and chest x-ray pending. Patient given albuterol and Atrovent.  11:33 AM UA without signs of infection today. Patient treated with a one-time dose of Flagyl for trichomonas and she was unable to fill her prescription. Chest x-ray shows some mild pulmonary interstitial prominence but otherwise no pneumonia or pulmonary edema. Patient had significant improvement after meds. She was discharged home with prednisone.  Blanchie Dessert, MD 03/14/16 1134

## 2016-03-14 NOTE — ED Notes (Signed)
Pt presents with diffuse hives and itching. Pt states she overdosed on heroin on Wednesday, was given narcan and taken to a local hospital. Pt states the hives started following the narcan and have progressively gotten worse. Pt has not taken benadryl since last night.

## 2016-03-14 NOTE — ED Notes (Signed)
MD at bedside. 

## 2016-03-14 NOTE — ED Notes (Addendum)
Pt states she did not get the rx filled from the last visit due to finances. Pt advises she has not used heroin since Wednesday.  Pt denies SOB, no oral swelling noted.

## 2017-04-10 IMAGING — DX DG ABDOMEN 1V
2 series · 2 of 2 positions shown · non-contrast
Comparison: None.

CLINICAL DATA: Abdominal distension and constipation for 4 weeks.

EXAM:
ABDOMEN - 1 VIEW

[abdomen kub (1 of 2)]
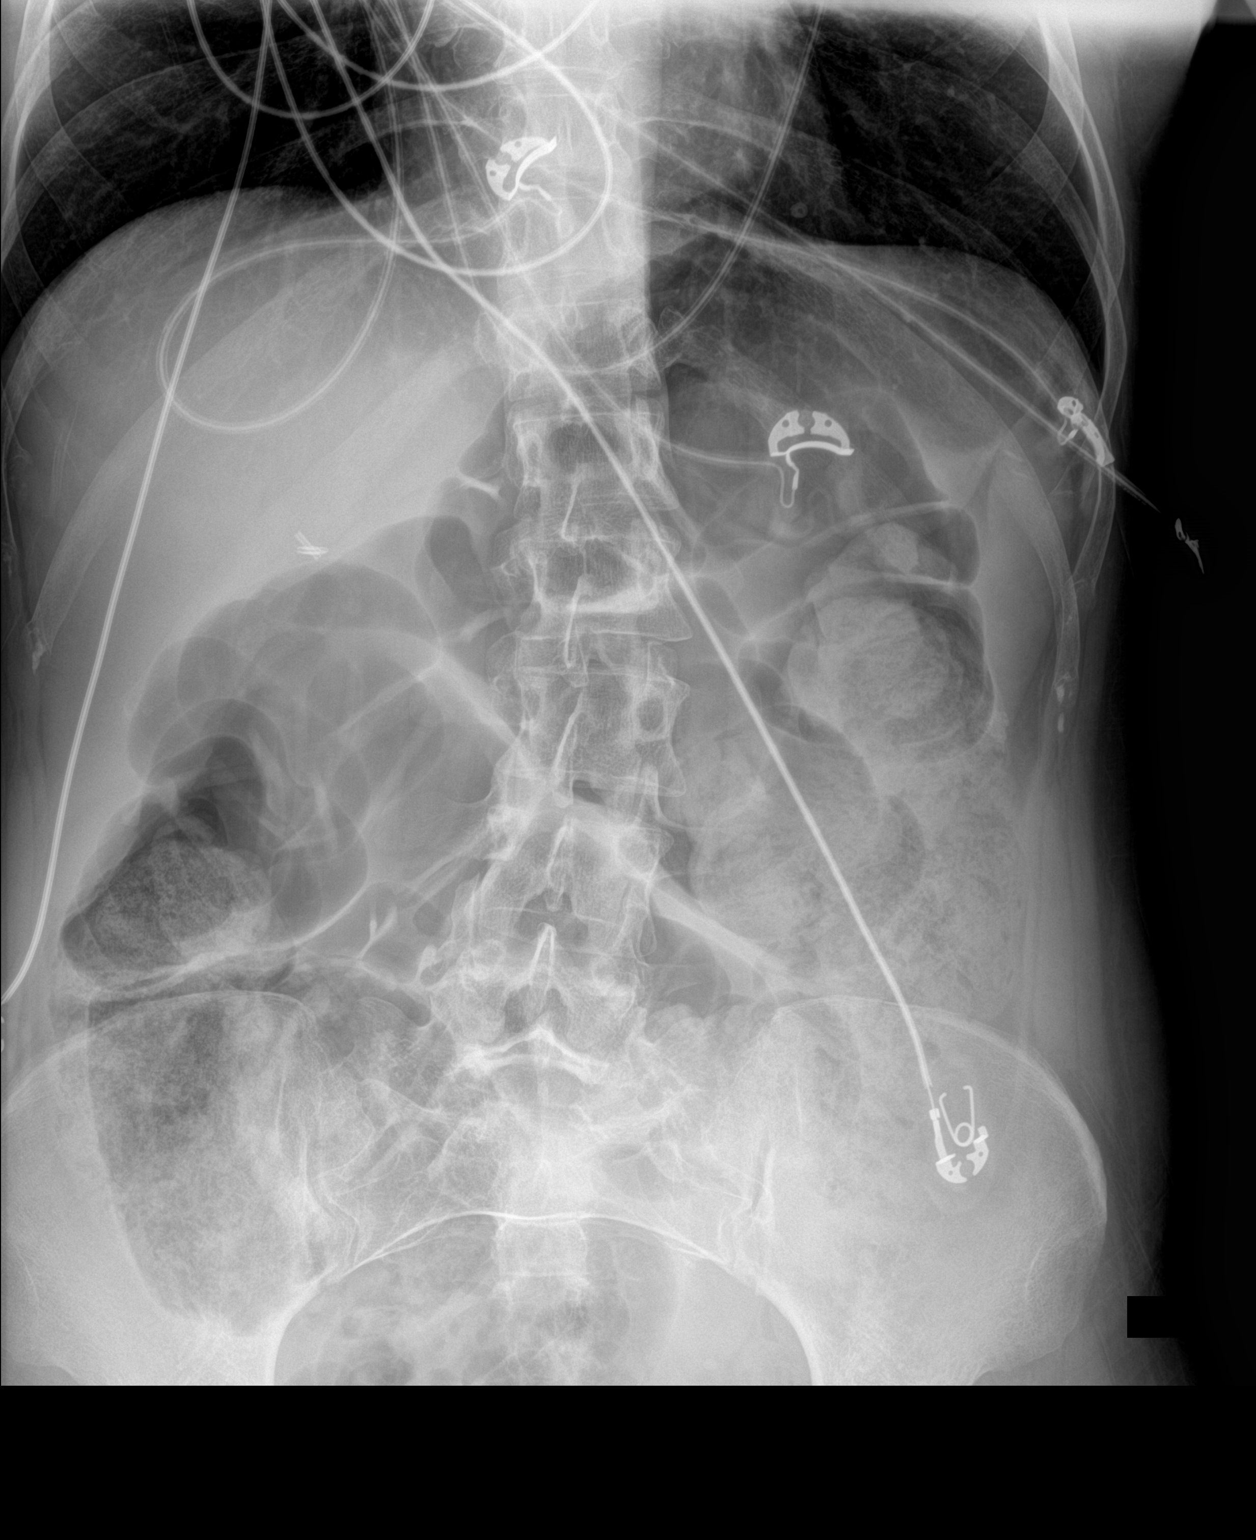

[abdomen kub (2 of 2)]
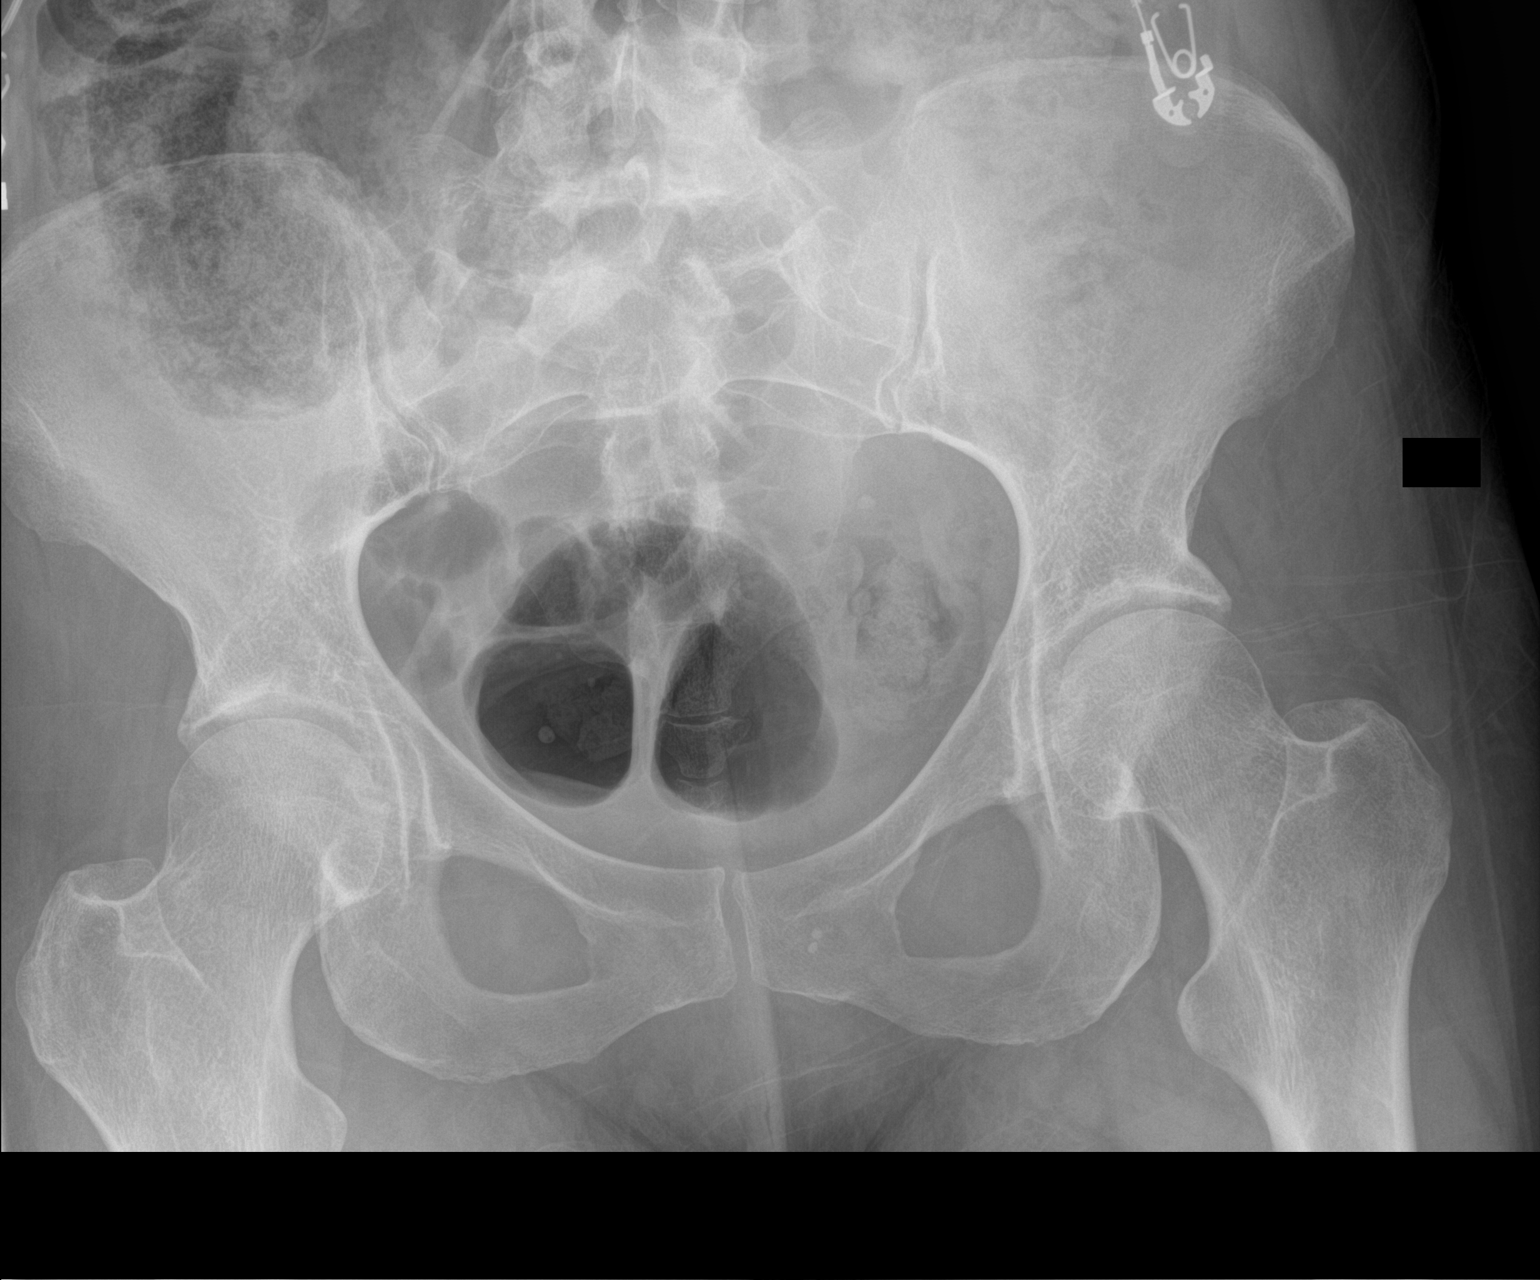

[2 of 2 positions shown; findings below may reference images not displayed]

FINDINGS: The bowel gas pattern is normal. Extensive bowel content is
identified throughout colon. There is scoliosis of spine. Prior
cholecystectomy clips are noted. No radio-opaque calculi or other
significant radiographic abnormality are seen.
IMPRESSION: Constipation.  No bowel obstruction.

## 2017-04-10 IMAGING — DX DG CHEST 1V PORT
1 series · 1 of 1 positions shown · non-contrast
Comparison: None.

CLINICAL DATA: Wheezing and hives of left forearm.

EXAM:
PORTABLE CHEST 1 VIEW

[chest ap]
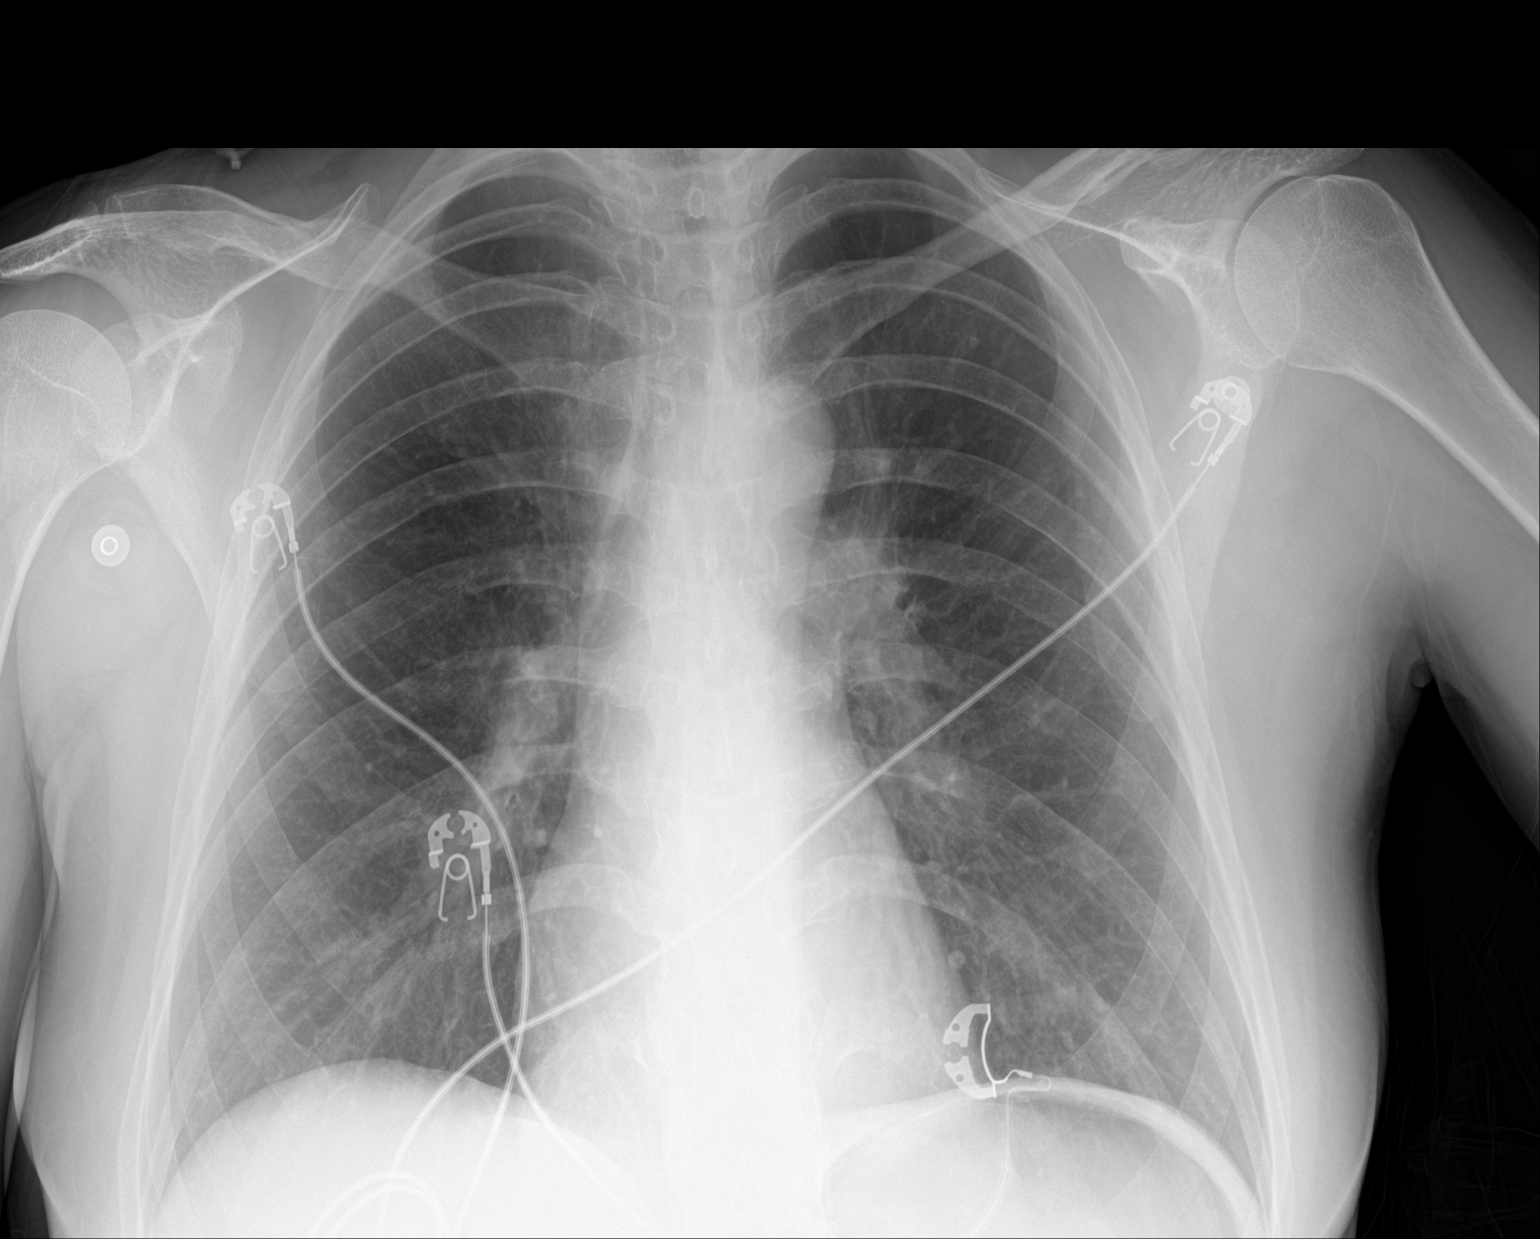

[1 of 1 positions shown; findings below may reference images not displayed]

FINDINGS: The heart size and mediastinal contours are within normal limits.
Both lungs are clear. The visualized skeletal structures are
unremarkable.
IMPRESSION: No active cardiopulmonary disease.

## 2017-04-13 IMAGING — DX DG CHEST 2V
2 series · 2 of 2 positions shown · non-contrast
Comparison: Portable chest x-ray March 11, 2016

CLINICAL DATA: Diffuse hiatus and itching, patient reports overdose
of heroin 2 days ago and treated with Narcan; patient reports onset
of symptoms after Narcan administration, difficulty breathing,
history of hepatitis-C

EXAM:
CHEST  2 VIEW

[chest pa]
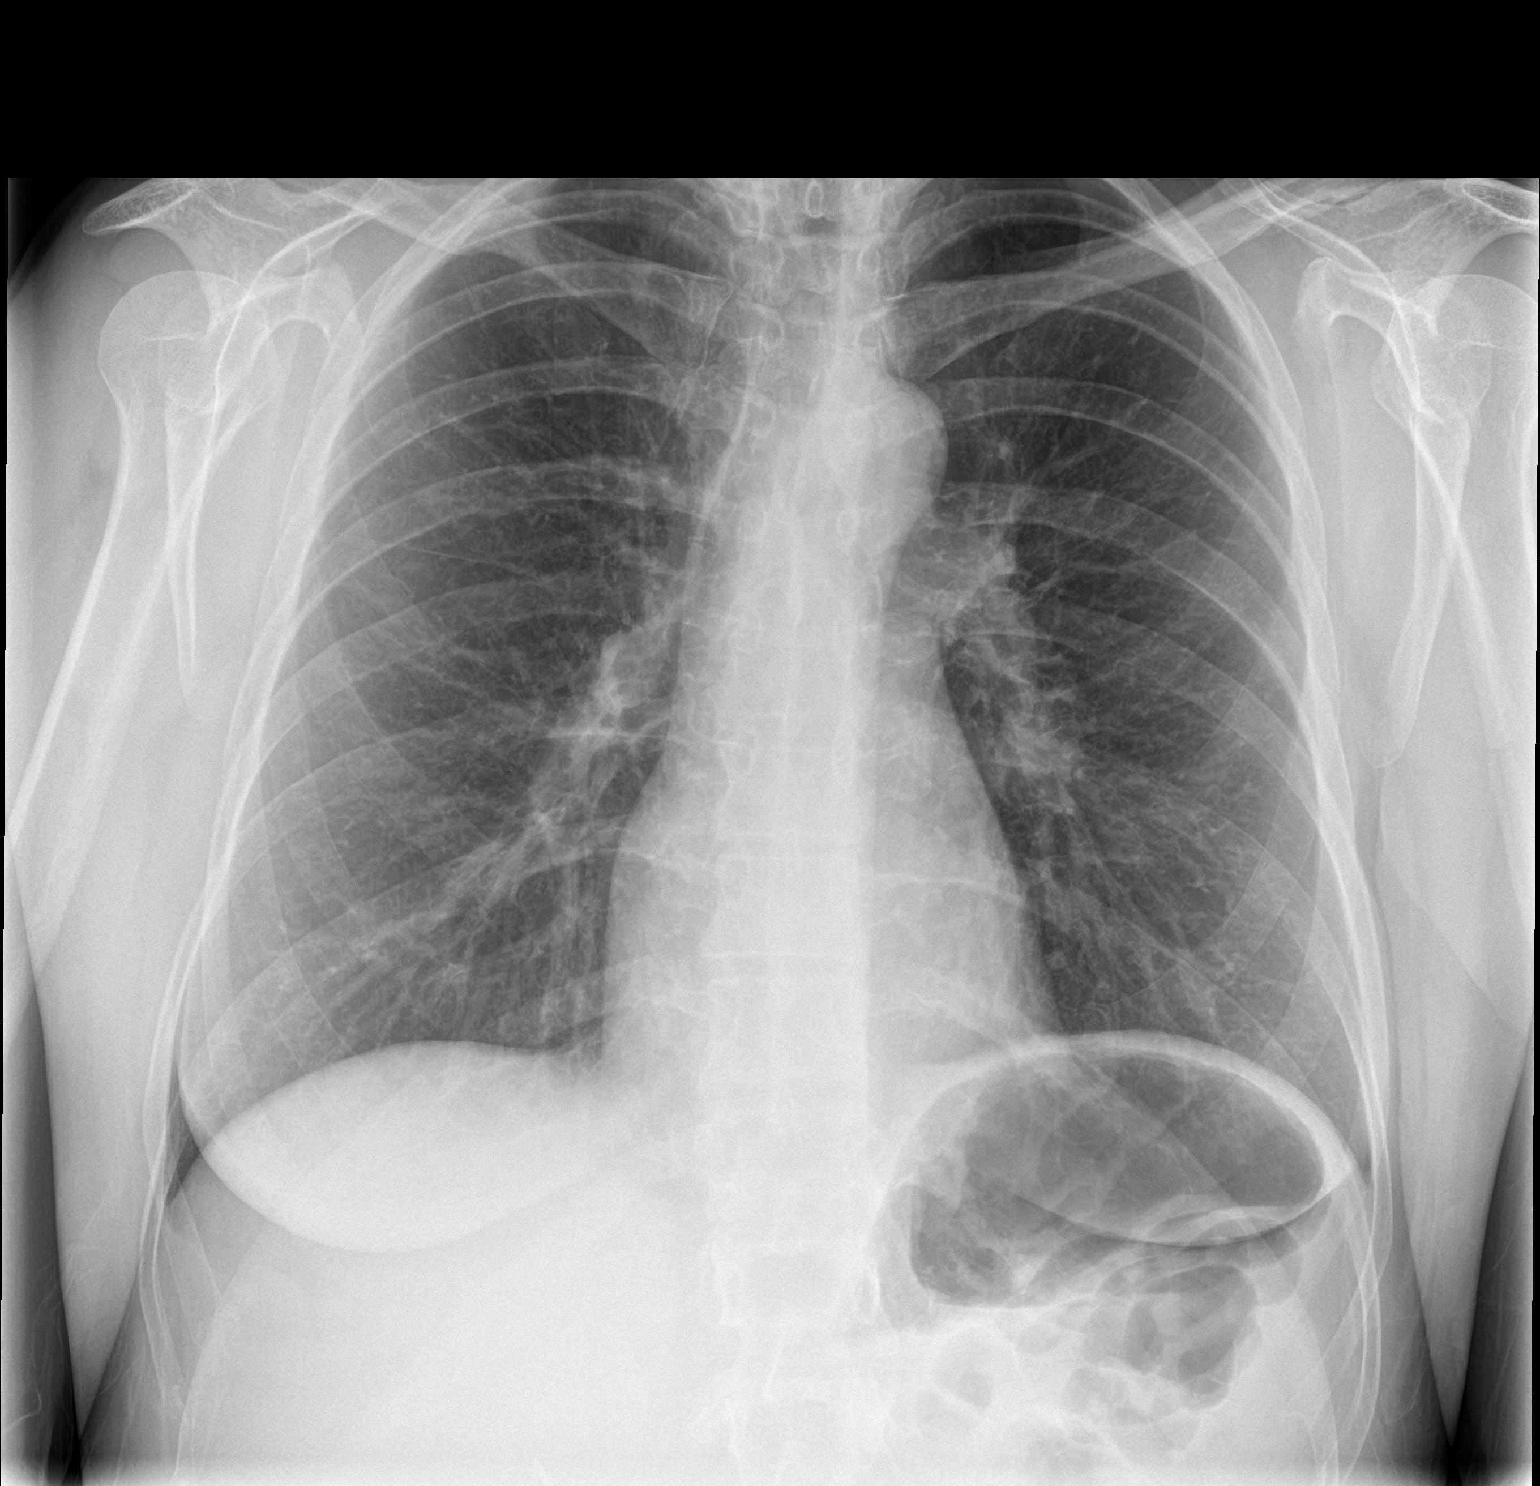

[chest lat]
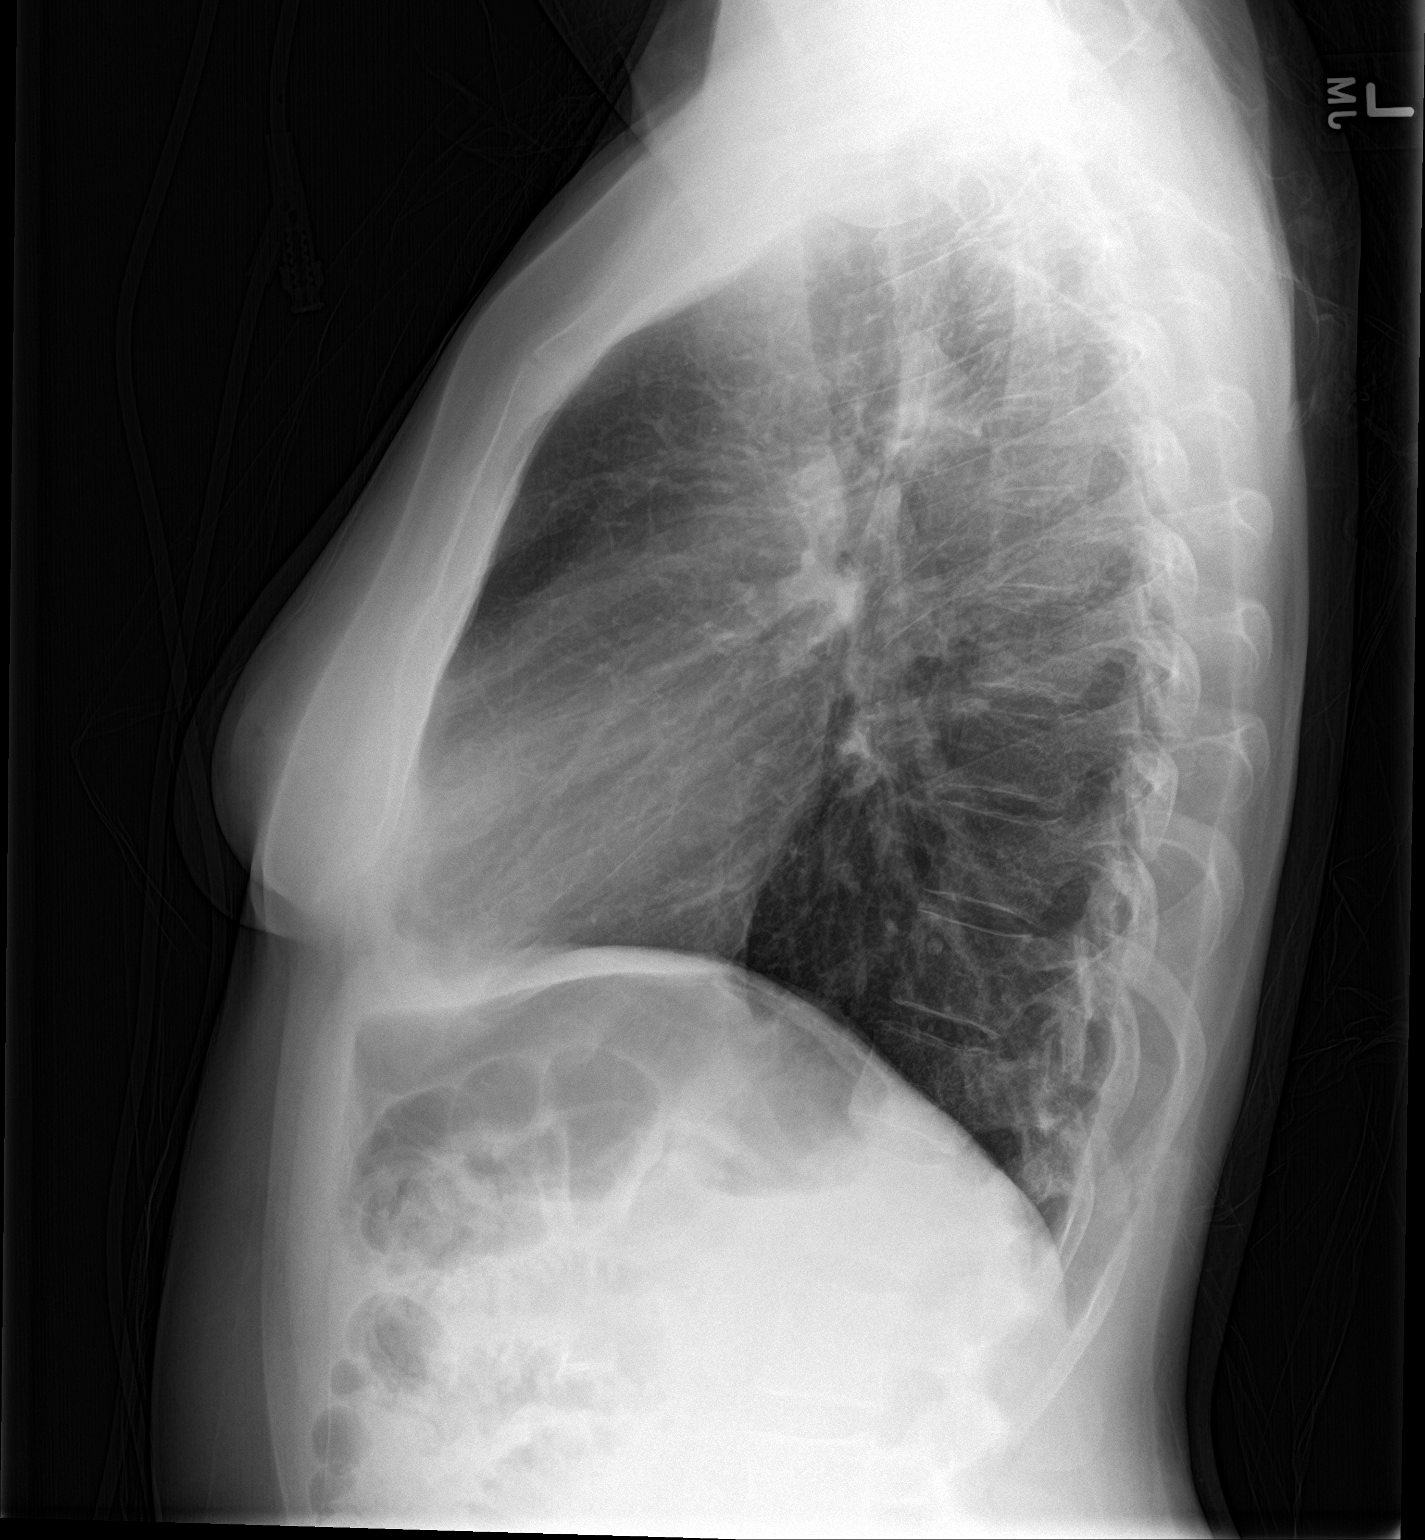

[2 of 2 positions shown; findings below may reference images not displayed]

FINDINGS: The lungs are adequately inflated. The interstitial markings are
slightly more conspicuous bilaterally. There is no alveolar
infiltrate. There is no pleural effusion, pneumothorax, or
pneumomediastinum. The heart and pulmonary vascularity are normal.
The mediastinum is normal in width. The bony thorax exhibits no
acute abnormality.
IMPRESSION: Mild pulmonary interstitial prominence today as compared to the
previous study which may reflect minimal interstitial edema. There
is no alveolar infiltrate nor evidence of pulmonary vascular
congestion.
